# Patient Record
Sex: Female | Born: 1945
Health system: Southern US, Community
[De-identification: ages and names within clinical notes are randomized; demographics above are authoritative.]

## PROBLEM LIST (undated history)

## (undated) DIAGNOSIS — K219 Gastro-esophageal reflux disease without esophagitis: Secondary | ICD-10-CM

## (undated) DIAGNOSIS — M1990 Unspecified osteoarthritis, unspecified site: Secondary | ICD-10-CM

## (undated) HISTORY — DX: Gastro-esophageal reflux disease without esophagitis: K21.9

## (undated) HISTORY — PX: TUBAL LIGATION: SHX77

## (undated) HISTORY — PX: COLONOSCOPY: SHX174

## (undated) HISTORY — DX: Unspecified osteoarthritis, unspecified site: M19.90

## (undated) HISTORY — PX: TOTAL ABDOMINAL HYSTERECTOMY: SHX209

## (undated) HISTORY — PX: CHOLECYSTECTOMY: SHX55

---

## 2007-10-10 ENCOUNTER — Ambulatory Visit: Payer: Self-pay | Admitting: Cardiology

## 2011-06-07 ENCOUNTER — Encounter (INDEPENDENT_AMBULATORY_CARE_PROVIDER_SITE_OTHER): Payer: Self-pay | Admitting: *Deleted

## 2011-06-22 ENCOUNTER — Ambulatory Visit (INDEPENDENT_AMBULATORY_CARE_PROVIDER_SITE_OTHER): Payer: Medicare Other | Admitting: Internal Medicine

## 2011-06-22 ENCOUNTER — Encounter (INDEPENDENT_AMBULATORY_CARE_PROVIDER_SITE_OTHER): Payer: Self-pay | Admitting: Internal Medicine

## 2011-06-22 DIAGNOSIS — K219 Gastro-esophageal reflux disease without esophagitis: Secondary | ICD-10-CM

## 2011-06-22 DIAGNOSIS — K5732 Diverticulitis of large intestine without perforation or abscess without bleeding: Secondary | ICD-10-CM

## 2011-06-22 DIAGNOSIS — G8929 Other chronic pain: Secondary | ICD-10-CM

## 2011-06-22 DIAGNOSIS — N200 Calculus of kidney: Secondary | ICD-10-CM

## 2011-06-22 DIAGNOSIS — K5792 Diverticulitis of intestine, part unspecified, without perforation or abscess without bleeding: Secondary | ICD-10-CM

## 2011-06-22 NOTE — Progress Notes (Signed)
Subjective:     Patient ID: Brenda Sullivan, female   DOB: April 07, 1946, 66 y.o.   MRN: [id number]  HPI Brenda Sullivan is a 66 yr old female referred to our office by [facility] for GERD with treatment failure. She has tried Prilosec, Nexium, Aciphex,  and Protonix without any relief. She was recently started on Dexilant a few weeks ago. She says the Dexilant has helped.  She tells me she is 50% better since starting the Dexilant. She tells me she has had 2 episodes of acid reflux since starting the Dexilant.  She takes Tums or Maalox if she needs it. She tells me she is having acid reflux a couple of times a week. Appetite is good. No weight loss. She avoids spicy foods. She usually has a BM about every day or every other day.  She tells me she has had [employer] wrap x 2 in the past. No dysphagia.  Her acid reflux x 3 week. Burping frequently.   Review of Systems see hpi     Current Outpatient Prescriptions  Medication Sig Dispense Refill  . aspirin 81 MG tablet Take 81 mg by mouth daily.      . calcium carbonate 200 MG capsule Take 250 mg by mouth 2 (two) times daily with a meal.      . dexlansoprazole (DEXILANT) 60 MG capsule Take 60 mg by mouth daily.      Marland Kitchen estrogens, conjugated, (PREMARIN) 0.45 MG tablet Take 0.45 mg by mouth daily. Take daily for 21 days then do not take for 7 days.      Marland Kitchen FLUoxetine (PROZAC) 20 MG capsule Take 20 mg by mouth daily.      Marland Kitchen gabapentin (NEURONTIN) 100 MG capsule Take 300 mg by mouth at bedtime as needed and may repeat dose one time if needed.      Marland Kitchen oxyCODONE (OXYCONTIN) 20 MG 12 hr tablet Take 20 mg by mouth every 12 (twelve) hours.      . pyridoxine (B-6) 200 MG tablet Take 200 mg by mouth daily.      . ranitidine (ZANTAC) 150 MG capsule Take 150 mg by mouth as needed.      Marland Kitchen tiZANidine (ZANAFLEX) 4 MG capsule Take 4 mg by mouth 3 (three) times daily.       Past Medical History  Diagnosis Date  . GERD (gastroesophageal reflux disease)   . Arthritis      No Known  Allergies    History reviewed. No pertinent family history. History   Social History  . Marital Status: Married    Spouse Name: N/A    Number of Children: N/A  . Years of Education: N/A   Occupational History  . Not on file.   Social History Main Topics  . Smoking status: Never Smoker   . Smokeless tobacco: Not on file  . Alcohol Use: No  . Drug Use: No  . Sexually Active: Not on file   Other Topics Concern  . Not on file   Social History Narrative  . No narrative on file   Family Status  Relation Status Death Age  . Mother Deceased     Stomach and intestional  . Father Deceased     CVA, CAD  . Sister Deceased     DM2, Needed a liver transplant    Objective:   Physical Exam  Filed Vitals:   06/22/11 1013  Height: 5\' 3"  (1.6 m)  Weight: 169 lb 14.4 oz (77.066 kg)   Alert  and oriented. Skin warm and dry. Oral mucosa is moist.   . Sclera anicteric, conjunctivae is pink. Thyroid not enlarged. No cervical lymphadenopathy. Lungs clear. Heart regular rate and rhythm.  Abdomen is soft. Bowel sounds are positive. No hepatomegaly. No abdominal masses felt. No tenderness.  No edema to lower extremities. Patient is alert and oriented.      Assessment:    GERD which is better now after starting the Dexilant.     Plan:   PR in 2 weeks. If no significant improvement, will consider an EGD.  Instructions on GERD given to patient with a GERD diet.

## 2011-06-22 NOTE — Patient Instructions (Addendum)
HOB up. Conitinue the Dexilant. PR in 2 weeks.

## 2011-10-13 ENCOUNTER — Encounter (INDEPENDENT_AMBULATORY_CARE_PROVIDER_SITE_OTHER): Payer: Self-pay

## 2013-05-08 HISTORY — PX: BREAST BIOPSY: SHX20

## 2014-03-04 ENCOUNTER — Other Ambulatory Visit: Payer: Self-pay | Admitting: Internal Medicine

## 2014-03-04 DIAGNOSIS — R921 Mammographic calcification found on diagnostic imaging of breast: Secondary | ICD-10-CM

## 2014-03-16 ENCOUNTER — Ambulatory Visit
Admission: RE | Admit: 2014-03-16 | Discharge: 2014-03-16 | Disposition: A | Discharge status: Home / Self Care | Payer: Medicare Other | Source: Ambulatory Visit | Attending: Internal Medicine | Admitting: Internal Medicine

## 2014-03-16 ENCOUNTER — Encounter (INDEPENDENT_AMBULATORY_CARE_PROVIDER_SITE_OTHER): Payer: Self-pay

## 2014-03-16 DIAGNOSIS — R921 Mammographic calcification found on diagnostic imaging of breast: Secondary | ICD-10-CM

## 2014-08-05 ENCOUNTER — Encounter (INDEPENDENT_AMBULATORY_CARE_PROVIDER_SITE_OTHER): Payer: Self-pay | Admitting: *Deleted

## 2014-08-05 ENCOUNTER — Encounter (INDEPENDENT_AMBULATORY_CARE_PROVIDER_SITE_OTHER): Payer: Self-pay

## 2016-05-18 DIAGNOSIS — R300 Dysuria: Secondary | ICD-10-CM | POA: Diagnosis not present

## 2016-05-18 DIAGNOSIS — N3281 Overactive bladder: Secondary | ICD-10-CM | POA: Diagnosis not present

## 2016-06-14 DIAGNOSIS — B356 Tinea cruris: Secondary | ICD-10-CM | POA: Diagnosis not present

## 2016-06-14 DIAGNOSIS — J069 Acute upper respiratory infection, unspecified: Secondary | ICD-10-CM | POA: Diagnosis not present

## 2016-06-27 DIAGNOSIS — M47812 Spondylosis without myelopathy or radiculopathy, cervical region: Secondary | ICD-10-CM | POA: Diagnosis not present

## 2016-06-27 DIAGNOSIS — Z79891 Long term (current) use of opiate analgesic: Secondary | ICD-10-CM | POA: Diagnosis not present

## 2016-06-27 DIAGNOSIS — M25571 Pain in right ankle and joints of right foot: Secondary | ICD-10-CM | POA: Diagnosis not present

## 2016-06-27 DIAGNOSIS — G894 Chronic pain syndrome: Secondary | ICD-10-CM | POA: Diagnosis not present

## 2016-08-01 DIAGNOSIS — Z79899 Other long term (current) drug therapy: Secondary | ICD-10-CM | POA: Diagnosis not present

## 2016-08-01 DIAGNOSIS — Z1211 Encounter for screening for malignant neoplasm of colon: Secondary | ICD-10-CM | POA: Diagnosis not present

## 2016-08-01 DIAGNOSIS — E2839 Other primary ovarian failure: Secondary | ICD-10-CM | POA: Diagnosis not present

## 2016-08-01 DIAGNOSIS — Z1389 Encounter for screening for other disorder: Secondary | ICD-10-CM | POA: Diagnosis not present

## 2016-08-01 DIAGNOSIS — Z299 Encounter for prophylactic measures, unspecified: Secondary | ICD-10-CM | POA: Diagnosis not present

## 2016-08-01 DIAGNOSIS — Z7189 Other specified counseling: Secondary | ICD-10-CM | POA: Diagnosis not present

## 2016-08-01 DIAGNOSIS — R69 Illness, unspecified: Secondary | ICD-10-CM | POA: Diagnosis not present

## 2016-08-01 DIAGNOSIS — Z0000 Encounter for general adult medical examination without abnormal findings: Secondary | ICD-10-CM | POA: Diagnosis not present

## 2016-08-01 DIAGNOSIS — Z1231 Encounter for screening mammogram for malignant neoplasm of breast: Secondary | ICD-10-CM | POA: Diagnosis not present

## 2016-08-01 DIAGNOSIS — Z713 Dietary counseling and surveillance: Secondary | ICD-10-CM | POA: Diagnosis not present

## 2016-08-02 DIAGNOSIS — Z79899 Other long term (current) drug therapy: Secondary | ICD-10-CM | POA: Diagnosis not present

## 2016-08-02 DIAGNOSIS — Z0000 Encounter for general adult medical examination without abnormal findings: Secondary | ICD-10-CM | POA: Diagnosis not present

## 2016-08-02 DIAGNOSIS — R69 Illness, unspecified: Secondary | ICD-10-CM | POA: Diagnosis not present

## 2016-08-24 DIAGNOSIS — Z79891 Long term (current) use of opiate analgesic: Secondary | ICD-10-CM | POA: Diagnosis not present

## 2016-08-24 DIAGNOSIS — G894 Chronic pain syndrome: Secondary | ICD-10-CM | POA: Diagnosis not present

## 2016-08-24 DIAGNOSIS — M47812 Spondylosis without myelopathy or radiculopathy, cervical region: Secondary | ICD-10-CM | POA: Diagnosis not present

## 2016-08-24 DIAGNOSIS — M25571 Pain in right ankle and joints of right foot: Secondary | ICD-10-CM | POA: Diagnosis not present

## 2016-10-19 DIAGNOSIS — M47812 Spondylosis without myelopathy or radiculopathy, cervical region: Secondary | ICD-10-CM | POA: Diagnosis not present

## 2016-10-19 DIAGNOSIS — Z79891 Long term (current) use of opiate analgesic: Secondary | ICD-10-CM | POA: Diagnosis not present

## 2016-10-19 DIAGNOSIS — M25571 Pain in right ankle and joints of right foot: Secondary | ICD-10-CM | POA: Diagnosis not present

## 2016-10-19 DIAGNOSIS — G894 Chronic pain syndrome: Secondary | ICD-10-CM | POA: Diagnosis not present

## 2016-12-20 ENCOUNTER — Ambulatory Visit: Payer: Self-pay | Admitting: Urology

## 2016-12-29 DIAGNOSIS — G894 Chronic pain syndrome: Secondary | ICD-10-CM | POA: Diagnosis not present

## 2016-12-29 DIAGNOSIS — M47812 Spondylosis without myelopathy or radiculopathy, cervical region: Secondary | ICD-10-CM | POA: Diagnosis not present

## 2016-12-29 DIAGNOSIS — Z79891 Long term (current) use of opiate analgesic: Secondary | ICD-10-CM | POA: Diagnosis not present

## 2016-12-29 DIAGNOSIS — M25571 Pain in right ankle and joints of right foot: Secondary | ICD-10-CM | POA: Diagnosis not present

## 2017-01-02 DIAGNOSIS — Z299 Encounter for prophylactic measures, unspecified: Secondary | ICD-10-CM | POA: Diagnosis not present

## 2017-01-02 DIAGNOSIS — R600 Localized edema: Secondary | ICD-10-CM | POA: Diagnosis not present

## 2017-01-02 DIAGNOSIS — R69 Illness, unspecified: Secondary | ICD-10-CM | POA: Diagnosis not present

## 2017-01-02 DIAGNOSIS — Z6835 Body mass index (BMI) 35.0-35.9, adult: Secondary | ICD-10-CM | POA: Diagnosis not present

## 2017-01-02 DIAGNOSIS — M19079 Primary osteoarthritis, unspecified ankle and foot: Secondary | ICD-10-CM | POA: Diagnosis not present

## 2017-01-02 DIAGNOSIS — N951 Menopausal and female climacteric states: Secondary | ICD-10-CM | POA: Diagnosis not present

## 2017-01-16 ENCOUNTER — Other Ambulatory Visit: Payer: Self-pay | Admitting: Urology

## 2017-01-16 DIAGNOSIS — N200 Calculus of kidney: Secondary | ICD-10-CM

## 2017-02-07 ENCOUNTER — Ambulatory Visit: Payer: Self-pay | Admitting: Urology

## 2017-02-13 ENCOUNTER — Ambulatory Visit (HOSPITAL_COMMUNITY)
Admission: RE | Admit: 2017-02-13 | Discharge: 2017-02-13 | Disposition: A | Discharge status: Home / Self Care | Payer: Medicare HMO | Source: Ambulatory Visit | Attending: Urology | Admitting: Urology

## 2017-02-13 DIAGNOSIS — N200 Calculus of kidney: Secondary | ICD-10-CM | POA: Insufficient documentation

## 2017-02-21 ENCOUNTER — Other Ambulatory Visit (HOSPITAL_COMMUNITY)
Admission: AD | Admit: 2017-02-21 | Discharge: 2017-02-21 | Disposition: A | Discharge status: Home / Self Care | Payer: Medicare HMO | Source: Skilled Nursing Facility | Attending: Urology | Admitting: Urology

## 2017-02-21 ENCOUNTER — Ambulatory Visit (INDEPENDENT_AMBULATORY_CARE_PROVIDER_SITE_OTHER): Payer: Medicare HMO | Admitting: Urology

## 2017-02-21 DIAGNOSIS — N3281 Overactive bladder: Secondary | ICD-10-CM | POA: Diagnosis not present

## 2017-02-21 DIAGNOSIS — R300 Dysuria: Secondary | ICD-10-CM | POA: Diagnosis not present

## 2017-02-21 DIAGNOSIS — N200 Calculus of kidney: Secondary | ICD-10-CM | POA: Diagnosis not present

## 2017-02-24 LAB — URINE CULTURE: Culture: 70000 — AB

## 2017-02-27 DIAGNOSIS — Z79891 Long term (current) use of opiate analgesic: Secondary | ICD-10-CM | POA: Diagnosis not present

## 2017-02-27 DIAGNOSIS — M47812 Spondylosis without myelopathy or radiculopathy, cervical region: Secondary | ICD-10-CM | POA: Diagnosis not present

## 2017-02-27 DIAGNOSIS — M25571 Pain in right ankle and joints of right foot: Secondary | ICD-10-CM | POA: Diagnosis not present

## 2017-02-27 DIAGNOSIS — G894 Chronic pain syndrome: Secondary | ICD-10-CM | POA: Diagnosis not present

## 2017-04-26 DIAGNOSIS — Z79891 Long term (current) use of opiate analgesic: Secondary | ICD-10-CM | POA: Diagnosis not present

## 2017-04-26 DIAGNOSIS — G894 Chronic pain syndrome: Secondary | ICD-10-CM | POA: Diagnosis not present

## 2017-04-26 DIAGNOSIS — M25571 Pain in right ankle and joints of right foot: Secondary | ICD-10-CM | POA: Diagnosis not present

## 2017-04-26 DIAGNOSIS — M47812 Spondylosis without myelopathy or radiculopathy, cervical region: Secondary | ICD-10-CM | POA: Diagnosis not present

## 2017-06-26 DIAGNOSIS — M25571 Pain in right ankle and joints of right foot: Secondary | ICD-10-CM | POA: Diagnosis not present

## 2017-06-26 DIAGNOSIS — M47812 Spondylosis without myelopathy or radiculopathy, cervical region: Secondary | ICD-10-CM | POA: Diagnosis not present

## 2017-06-26 DIAGNOSIS — Z79891 Long term (current) use of opiate analgesic: Secondary | ICD-10-CM | POA: Diagnosis not present

## 2017-06-26 DIAGNOSIS — G894 Chronic pain syndrome: Secondary | ICD-10-CM | POA: Diagnosis not present

## 2017-08-15 DIAGNOSIS — Z1211 Encounter for screening for malignant neoplasm of colon: Secondary | ICD-10-CM | POA: Diagnosis not present

## 2017-08-15 DIAGNOSIS — Z1331 Encounter for screening for depression: Secondary | ICD-10-CM | POA: Diagnosis not present

## 2017-08-15 DIAGNOSIS — E2839 Other primary ovarian failure: Secondary | ICD-10-CM | POA: Diagnosis not present

## 2017-08-15 DIAGNOSIS — R69 Illness, unspecified: Secondary | ICD-10-CM | POA: Diagnosis not present

## 2017-08-15 DIAGNOSIS — Z7189 Other specified counseling: Secondary | ICD-10-CM | POA: Diagnosis not present

## 2017-08-15 DIAGNOSIS — Z6835 Body mass index (BMI) 35.0-35.9, adult: Secondary | ICD-10-CM | POA: Diagnosis not present

## 2017-08-15 DIAGNOSIS — Z0000 Encounter for general adult medical examination without abnormal findings: Secondary | ICD-10-CM | POA: Diagnosis not present

## 2017-08-15 DIAGNOSIS — Z299 Encounter for prophylactic measures, unspecified: Secondary | ICD-10-CM | POA: Diagnosis not present

## 2017-08-15 DIAGNOSIS — R21 Rash and other nonspecific skin eruption: Secondary | ICD-10-CM | POA: Diagnosis not present

## 2017-08-15 DIAGNOSIS — Z1339 Encounter for screening examination for other mental health and behavioral disorders: Secondary | ICD-10-CM | POA: Diagnosis not present

## 2017-08-17 DIAGNOSIS — Z79899 Other long term (current) drug therapy: Secondary | ICD-10-CM | POA: Diagnosis not present

## 2017-08-17 DIAGNOSIS — E7800 Pure hypercholesterolemia, unspecified: Secondary | ICD-10-CM | POA: Diagnosis not present

## 2017-08-17 DIAGNOSIS — R5383 Other fatigue: Secondary | ICD-10-CM | POA: Diagnosis not present

## 2017-08-23 DIAGNOSIS — M47812 Spondylosis without myelopathy or radiculopathy, cervical region: Secondary | ICD-10-CM | POA: Diagnosis not present

## 2017-08-23 DIAGNOSIS — M25571 Pain in right ankle and joints of right foot: Secondary | ICD-10-CM | POA: Diagnosis not present

## 2017-08-23 DIAGNOSIS — G894 Chronic pain syndrome: Secondary | ICD-10-CM | POA: Diagnosis not present

## 2017-08-23 DIAGNOSIS — Z79891 Long term (current) use of opiate analgesic: Secondary | ICD-10-CM | POA: Diagnosis not present

## 2017-08-31 ENCOUNTER — Other Ambulatory Visit: Payer: Self-pay | Admitting: Urology

## 2017-08-31 DIAGNOSIS — N200 Calculus of kidney: Secondary | ICD-10-CM

## 2017-09-06 DIAGNOSIS — E7800 Pure hypercholesterolemia, unspecified: Secondary | ICD-10-CM | POA: Diagnosis not present

## 2017-09-06 DIAGNOSIS — E2839 Other primary ovarian failure: Secondary | ICD-10-CM | POA: Diagnosis not present

## 2017-09-06 DIAGNOSIS — Z299 Encounter for prophylactic measures, unspecified: Secondary | ICD-10-CM | POA: Diagnosis not present

## 2017-09-06 DIAGNOSIS — L309 Dermatitis, unspecified: Secondary | ICD-10-CM | POA: Diagnosis not present

## 2017-09-06 DIAGNOSIS — Z6835 Body mass index (BMI) 35.0-35.9, adult: Secondary | ICD-10-CM | POA: Diagnosis not present

## 2017-09-06 DIAGNOSIS — Z713 Dietary counseling and surveillance: Secondary | ICD-10-CM | POA: Diagnosis not present

## 2017-10-18 ENCOUNTER — Ambulatory Visit (HOSPITAL_COMMUNITY)
Admission: RE | Admit: 2017-10-18 | Discharge: 2017-10-18 | Disposition: A | Discharge status: Home / Self Care | Payer: Medicare HMO | Source: Ambulatory Visit | Attending: Urology | Admitting: Urology

## 2017-10-18 DIAGNOSIS — N200 Calculus of kidney: Secondary | ICD-10-CM | POA: Diagnosis not present

## 2017-10-25 DIAGNOSIS — M25571 Pain in right ankle and joints of right foot: Secondary | ICD-10-CM | POA: Diagnosis not present

## 2017-10-25 DIAGNOSIS — M47812 Spondylosis without myelopathy or radiculopathy, cervical region: Secondary | ICD-10-CM | POA: Diagnosis not present

## 2017-10-25 DIAGNOSIS — Z79891 Long term (current) use of opiate analgesic: Secondary | ICD-10-CM | POA: Diagnosis not present

## 2017-10-25 DIAGNOSIS — G894 Chronic pain syndrome: Secondary | ICD-10-CM | POA: Diagnosis not present

## 2017-12-18 DIAGNOSIS — R350 Frequency of micturition: Secondary | ICD-10-CM | POA: Diagnosis not present

## 2017-12-18 DIAGNOSIS — N390 Urinary tract infection, site not specified: Secondary | ICD-10-CM | POA: Diagnosis not present

## 2017-12-18 DIAGNOSIS — Z6835 Body mass index (BMI) 35.0-35.9, adult: Secondary | ICD-10-CM | POA: Diagnosis not present

## 2017-12-18 DIAGNOSIS — Z299 Encounter for prophylactic measures, unspecified: Secondary | ICD-10-CM | POA: Diagnosis not present

## 2017-12-18 DIAGNOSIS — R69 Illness, unspecified: Secondary | ICD-10-CM | POA: Diagnosis not present

## 2017-12-18 DIAGNOSIS — E7800 Pure hypercholesterolemia, unspecified: Secondary | ICD-10-CM | POA: Diagnosis not present

## 2017-12-27 DIAGNOSIS — M47812 Spondylosis without myelopathy or radiculopathy, cervical region: Secondary | ICD-10-CM | POA: Diagnosis not present

## 2017-12-27 DIAGNOSIS — M25571 Pain in right ankle and joints of right foot: Secondary | ICD-10-CM | POA: Diagnosis not present

## 2017-12-27 DIAGNOSIS — Z79891 Long term (current) use of opiate analgesic: Secondary | ICD-10-CM | POA: Diagnosis not present

## 2017-12-27 DIAGNOSIS — G894 Chronic pain syndrome: Secondary | ICD-10-CM | POA: Diagnosis not present

## 2018-02-21 DIAGNOSIS — M47812 Spondylosis without myelopathy or radiculopathy, cervical region: Secondary | ICD-10-CM | POA: Diagnosis not present

## 2018-02-21 DIAGNOSIS — G894 Chronic pain syndrome: Secondary | ICD-10-CM | POA: Diagnosis not present

## 2018-02-21 DIAGNOSIS — Z79891 Long term (current) use of opiate analgesic: Secondary | ICD-10-CM | POA: Diagnosis not present

## 2018-02-21 DIAGNOSIS — M25571 Pain in right ankle and joints of right foot: Secondary | ICD-10-CM | POA: Diagnosis not present

## 2018-04-19 DIAGNOSIS — K210 Gastro-esophageal reflux disease with esophagitis: Secondary | ICD-10-CM | POA: Diagnosis not present

## 2018-04-19 DIAGNOSIS — N951 Menopausal and female climacteric states: Secondary | ICD-10-CM | POA: Diagnosis not present

## 2018-04-19 DIAGNOSIS — Z299 Encounter for prophylactic measures, unspecified: Secondary | ICD-10-CM | POA: Diagnosis not present

## 2018-04-19 DIAGNOSIS — Z6835 Body mass index (BMI) 35.0-35.9, adult: Secondary | ICD-10-CM | POA: Diagnosis not present

## 2018-04-19 DIAGNOSIS — I10 Essential (primary) hypertension: Secondary | ICD-10-CM | POA: Diagnosis not present

## 2018-04-19 DIAGNOSIS — R69 Illness, unspecified: Secondary | ICD-10-CM | POA: Diagnosis not present

## 2018-04-25 DIAGNOSIS — M47812 Spondylosis without myelopathy or radiculopathy, cervical region: Secondary | ICD-10-CM | POA: Diagnosis not present

## 2018-04-25 DIAGNOSIS — G894 Chronic pain syndrome: Secondary | ICD-10-CM | POA: Diagnosis not present

## 2018-04-25 DIAGNOSIS — M25571 Pain in right ankle and joints of right foot: Secondary | ICD-10-CM | POA: Diagnosis not present

## 2018-04-25 DIAGNOSIS — Z79891 Long term (current) use of opiate analgesic: Secondary | ICD-10-CM | POA: Diagnosis not present

## 2018-05-21 DIAGNOSIS — R69 Illness, unspecified: Secondary | ICD-10-CM | POA: Diagnosis not present

## 2018-05-21 DIAGNOSIS — Z6834 Body mass index (BMI) 34.0-34.9, adult: Secondary | ICD-10-CM | POA: Diagnosis not present

## 2018-05-21 DIAGNOSIS — I10 Essential (primary) hypertension: Secondary | ICD-10-CM | POA: Diagnosis not present

## 2018-05-21 DIAGNOSIS — K219 Gastro-esophageal reflux disease without esophagitis: Secondary | ICD-10-CM | POA: Diagnosis not present

## 2018-05-21 DIAGNOSIS — G4700 Insomnia, unspecified: Secondary | ICD-10-CM | POA: Diagnosis not present

## 2018-06-27 DIAGNOSIS — Z79891 Long term (current) use of opiate analgesic: Secondary | ICD-10-CM | POA: Diagnosis not present

## 2018-06-27 DIAGNOSIS — M47812 Spondylosis without myelopathy or radiculopathy, cervical region: Secondary | ICD-10-CM | POA: Diagnosis not present

## 2018-06-27 DIAGNOSIS — G894 Chronic pain syndrome: Secondary | ICD-10-CM | POA: Diagnosis not present

## 2018-06-27 DIAGNOSIS — M25571 Pain in right ankle and joints of right foot: Secondary | ICD-10-CM | POA: Diagnosis not present

## 2018-08-20 DIAGNOSIS — Z79899 Other long term (current) drug therapy: Secondary | ICD-10-CM | POA: Diagnosis not present

## 2018-08-20 DIAGNOSIS — N390 Urinary tract infection, site not specified: Secondary | ICD-10-CM | POA: Diagnosis not present

## 2018-08-20 DIAGNOSIS — Z299 Encounter for prophylactic measures, unspecified: Secondary | ICD-10-CM | POA: Diagnosis not present

## 2018-08-20 DIAGNOSIS — Z1211 Encounter for screening for malignant neoplasm of colon: Secondary | ICD-10-CM | POA: Diagnosis not present

## 2018-08-20 DIAGNOSIS — Z0000 Encounter for general adult medical examination without abnormal findings: Secondary | ICD-10-CM | POA: Diagnosis not present

## 2018-08-20 DIAGNOSIS — Z1331 Encounter for screening for depression: Secondary | ICD-10-CM | POA: Diagnosis not present

## 2018-08-20 DIAGNOSIS — I10 Essential (primary) hypertension: Secondary | ICD-10-CM | POA: Diagnosis not present

## 2018-08-20 DIAGNOSIS — Z7189 Other specified counseling: Secondary | ICD-10-CM | POA: Diagnosis not present

## 2018-08-20 DIAGNOSIS — R5383 Other fatigue: Secondary | ICD-10-CM | POA: Diagnosis not present

## 2018-08-20 DIAGNOSIS — Z6835 Body mass index (BMI) 35.0-35.9, adult: Secondary | ICD-10-CM | POA: Diagnosis not present

## 2018-08-20 DIAGNOSIS — Z1339 Encounter for screening examination for other mental health and behavioral disorders: Secondary | ICD-10-CM | POA: Diagnosis not present

## 2018-08-20 DIAGNOSIS — E7800 Pure hypercholesterolemia, unspecified: Secondary | ICD-10-CM | POA: Diagnosis not present

## 2018-08-22 DIAGNOSIS — Z79891 Long term (current) use of opiate analgesic: Secondary | ICD-10-CM | POA: Diagnosis not present

## 2018-08-22 DIAGNOSIS — G894 Chronic pain syndrome: Secondary | ICD-10-CM | POA: Diagnosis not present

## 2018-08-22 DIAGNOSIS — M47812 Spondylosis without myelopathy or radiculopathy, cervical region: Secondary | ICD-10-CM | POA: Diagnosis not present

## 2018-08-22 DIAGNOSIS — M25571 Pain in right ankle and joints of right foot: Secondary | ICD-10-CM | POA: Diagnosis not present

## 2018-10-17 DIAGNOSIS — M47812 Spondylosis without myelopathy or radiculopathy, cervical region: Secondary | ICD-10-CM | POA: Diagnosis not present

## 2018-10-17 DIAGNOSIS — Z79891 Long term (current) use of opiate analgesic: Secondary | ICD-10-CM | POA: Diagnosis not present

## 2018-10-17 DIAGNOSIS — M25571 Pain in right ankle and joints of right foot: Secondary | ICD-10-CM | POA: Diagnosis not present

## 2018-10-17 DIAGNOSIS — G894 Chronic pain syndrome: Secondary | ICD-10-CM | POA: Diagnosis not present

## 2018-11-19 DIAGNOSIS — Z299 Encounter for prophylactic measures, unspecified: Secondary | ICD-10-CM | POA: Diagnosis not present

## 2018-11-19 DIAGNOSIS — Z6834 Body mass index (BMI) 34.0-34.9, adult: Secondary | ICD-10-CM | POA: Diagnosis not present

## 2018-11-19 DIAGNOSIS — E7800 Pure hypercholesterolemia, unspecified: Secondary | ICD-10-CM | POA: Diagnosis not present

## 2018-11-19 DIAGNOSIS — R69 Illness, unspecified: Secondary | ICD-10-CM | POA: Diagnosis not present

## 2018-11-19 DIAGNOSIS — I10 Essential (primary) hypertension: Secondary | ICD-10-CM | POA: Diagnosis not present

## 2018-12-12 DIAGNOSIS — M47812 Spondylosis without myelopathy or radiculopathy, cervical region: Secondary | ICD-10-CM | POA: Diagnosis not present

## 2018-12-12 DIAGNOSIS — M25571 Pain in right ankle and joints of right foot: Secondary | ICD-10-CM | POA: Diagnosis not present

## 2018-12-12 DIAGNOSIS — Z79891 Long term (current) use of opiate analgesic: Secondary | ICD-10-CM | POA: Diagnosis not present

## 2018-12-12 DIAGNOSIS — G894 Chronic pain syndrome: Secondary | ICD-10-CM | POA: Diagnosis not present

## 2018-12-18 DIAGNOSIS — R69 Illness, unspecified: Secondary | ICD-10-CM | POA: Diagnosis not present

## 2019-02-13 DIAGNOSIS — G894 Chronic pain syndrome: Secondary | ICD-10-CM | POA: Diagnosis not present

## 2019-02-13 DIAGNOSIS — M25571 Pain in right ankle and joints of right foot: Secondary | ICD-10-CM | POA: Diagnosis not present

## 2019-02-13 DIAGNOSIS — M47812 Spondylosis without myelopathy or radiculopathy, cervical region: Secondary | ICD-10-CM | POA: Diagnosis not present

## 2019-02-13 DIAGNOSIS — Z79891 Long term (current) use of opiate analgesic: Secondary | ICD-10-CM | POA: Diagnosis not present

## 2019-03-06 DIAGNOSIS — Z299 Encounter for prophylactic measures, unspecified: Secondary | ICD-10-CM | POA: Diagnosis not present

## 2019-03-06 DIAGNOSIS — J209 Acute bronchitis, unspecified: Secondary | ICD-10-CM | POA: Diagnosis not present

## 2019-03-06 DIAGNOSIS — Z6834 Body mass index (BMI) 34.0-34.9, adult: Secondary | ICD-10-CM | POA: Diagnosis not present

## 2019-03-06 DIAGNOSIS — R69 Illness, unspecified: Secondary | ICD-10-CM | POA: Diagnosis not present

## 2019-03-06 DIAGNOSIS — I10 Essential (primary) hypertension: Secondary | ICD-10-CM | POA: Diagnosis not present

## 2019-03-11 DIAGNOSIS — I10 Essential (primary) hypertension: Secondary | ICD-10-CM | POA: Diagnosis not present

## 2019-03-11 DIAGNOSIS — R32 Unspecified urinary incontinence: Secondary | ICD-10-CM | POA: Diagnosis not present

## 2019-03-11 DIAGNOSIS — Z6831 Body mass index (BMI) 31.0-31.9, adult: Secondary | ICD-10-CM | POA: Diagnosis not present

## 2019-03-11 DIAGNOSIS — R69 Illness, unspecified: Secondary | ICD-10-CM | POA: Diagnosis not present

## 2019-03-11 DIAGNOSIS — J209 Acute bronchitis, unspecified: Secondary | ICD-10-CM | POA: Diagnosis not present

## 2019-03-11 DIAGNOSIS — Z299 Encounter for prophylactic measures, unspecified: Secondary | ICD-10-CM | POA: Diagnosis not present

## 2019-03-12 DIAGNOSIS — E7800 Pure hypercholesterolemia, unspecified: Secondary | ICD-10-CM | POA: Diagnosis not present

## 2019-03-12 DIAGNOSIS — I10 Essential (primary) hypertension: Secondary | ICD-10-CM | POA: Diagnosis not present

## 2019-04-10 DIAGNOSIS — Z79891 Long term (current) use of opiate analgesic: Secondary | ICD-10-CM | POA: Diagnosis not present

## 2019-04-10 DIAGNOSIS — M25571 Pain in right ankle and joints of right foot: Secondary | ICD-10-CM | POA: Diagnosis not present

## 2019-04-10 DIAGNOSIS — G894 Chronic pain syndrome: Secondary | ICD-10-CM | POA: Diagnosis not present

## 2019-04-10 DIAGNOSIS — M47812 Spondylosis without myelopathy or radiculopathy, cervical region: Secondary | ICD-10-CM | POA: Diagnosis not present

## 2021-01-17 ENCOUNTER — Other Ambulatory Visit: Payer: Self-pay | Admitting: Internal Medicine

## 2021-01-17 DIAGNOSIS — Z139 Encounter for screening, unspecified: Secondary | ICD-10-CM

## 2021-01-18 ENCOUNTER — Other Ambulatory Visit: Payer: Self-pay

## 2021-01-18 ENCOUNTER — Ambulatory Visit
Admission: RE | Admit: 2021-01-18 | Discharge: 2021-01-18 | Disposition: A | Discharge status: Home / Self Care | Payer: Medicare HMO | Source: Ambulatory Visit | Attending: Internal Medicine | Admitting: Internal Medicine

## 2021-01-18 DIAGNOSIS — Z139 Encounter for screening, unspecified: Secondary | ICD-10-CM

## 2022-01-17 ENCOUNTER — Other Ambulatory Visit: Payer: Self-pay | Admitting: Internal Medicine

## 2022-01-17 DIAGNOSIS — Z1231 Encounter for screening mammogram for malignant neoplasm of breast: Secondary | ICD-10-CM

## 2022-01-18 ENCOUNTER — Inpatient Hospital Stay: Admission: RE | Admit: 2022-01-18 | Payer: Medicare HMO | Source: Ambulatory Visit

## 2022-03-02 ENCOUNTER — Ambulatory Visit
Admission: RE | Admit: 2022-03-02 | Discharge: 2022-03-02 | Disposition: A | Discharge status: Home / Self Care | Payer: Medicare HMO | Source: Ambulatory Visit | Attending: Internal Medicine | Admitting: Internal Medicine

## 2022-03-02 DIAGNOSIS — Z1231 Encounter for screening mammogram for malignant neoplasm of breast: Secondary | ICD-10-CM

## 2022-06-06 ENCOUNTER — Ambulatory Visit: Payer: Medicare HMO | Attending: Cardiology | Admitting: Cardiology

## 2022-06-06 ENCOUNTER — Encounter: Payer: Self-pay | Admitting: Cardiology

## 2022-06-06 ENCOUNTER — Encounter: Payer: Self-pay | Admitting: *Deleted

## 2022-06-06 VITALS — BP 120/76 | HR 64 | Ht 62.5 in | Wt 182.4 lb

## 2022-06-06 DIAGNOSIS — Z01812 Encounter for preprocedural laboratory examination: Secondary | ICD-10-CM | POA: Diagnosis not present

## 2022-06-06 DIAGNOSIS — R079 Chest pain, unspecified: Secondary | ICD-10-CM | POA: Diagnosis not present

## 2022-06-06 MED ORDER — METOPROLOL TARTRATE 50 MG PO TABS: 50.0000 mg | ORAL_TABLET | Freq: Once | ORAL | 0 refills | Status: AC

## 2022-06-06 NOTE — Progress Notes (Signed)
Clinical Summary Brenda Sullivan is a 77 y.o.female seen as a new consult for the following medical problems.  1.Chest pain - symptoms started about 6 months ago - can start udern right breast into midchest and epigastric area. Can last 15-20 minutes. Sharp pain, can be 10/10 but usually 5/10. Can have some nausea, weakness, some SOB. Can be worst with deep breathing. Can occur at rest or with exertion. No related to meals.  - history of GERD, on dexilant -occurs 1-2 times per month, thought recently happening weekly  - DOE with walking up 1 flight of stairs, occasional chest pain  CAD risk factors: HL, age  66/2023: LVEF 55-60%, grade I, mild MR, mild to mod TR -cannot run on treadmill due to chornic back pain.      Past Medical History:  Diagnosis Date   Arthritis    GERD (gastroesophageal reflux disease)      No Known Allergies   Current Outpatient Medications  Medication Sig Dispense Refill   aspirin 81 MG tablet Take 81 mg by mouth daily.     calcium carbonate 200 MG capsule Take 250 mg by mouth 2 (two) times daily with a meal.     dexlansoprazole (DEXILANT) 60 MG capsule Take 60 mg by mouth daily.     estrogens, conjugated, (PREMARIN) 0.45 MG tablet Take 0.45 mg by mouth daily. Take daily for 21 days then do not take for 7 days.     FLUoxetine (PROZAC) 20 MG capsule Take 20 mg by mouth daily.     gabapentin (NEURONTIN) 100 MG capsule Take 300 mg by mouth at bedtime as needed and may repeat dose one time if needed.     oxyCODONE (OXYCONTIN) 20 MG 12 hr tablet Take 20 mg by mouth every 12 (twelve) hours.     pyridoxine (B-6) 200 MG tablet Take 200 mg by mouth daily.     ranitidine (ZANTAC) 150 MG capsule Take 150 mg by mouth as needed.     tiZANidine (ZANAFLEX) 4 MG capsule Take 4 mg by mouth 3 (three) times daily.     No current facility-administered medications for this visit.     Past Surgical History:  Procedure Laterality Date   BREAST BIOPSY Left 2015    Benign   CHOLECYSTECTOMY     COLONOSCOPY     TOTAL ABDOMINAL HYSTERECTOMY     TUBAL LIGATION       No Known Allergies    Family History  Problem Relation Age of Onset   Breast cancer Neg Hx      Social History Ms. Macneill reports that she has never smoked. She does not have any smokeless tobacco history on file. Ms. Hedstrom reports no history of alcohol use.   Review of Systems CONSTITUTIONAL: No weight loss, fever, chills, weakness or fatigue.  HEENT: Eyes: No visual loss, blurred vision, double vision or yellow sclerae.No hearing loss, sneezing, congestion, runny nose or sore throat.  SKIN: No rash or itching.  CARDIOVASCULAR: per hpi RESPIRATORY: No shortness of breath, cough or sputum.  GASTROINTESTINAL: No anorexia, nausea, vomiting or diarrhea. No abdominal pain or blood.  GENITOURINARY: No burning on urination, no polyuria NEUROLOGICAL: No headache, dizziness, syncope, paralysis, ataxia, numbness or tingling in the extremities. No change in bowel or bladder control.  MUSCULOSKELETAL: No muscle, back pain, joint pain or stiffness.  LYMPHATICS: No enlarged nodes. No history of splenectomy.  PSYCHIATRIC: No history of depression or anxiety.  ENDOCRINOLOGIC: No reports of sweating,  cold or heat intolerance. No polyuria or polydipsia.  Marland Kitchen   Physical Examination Today's Vitals   06/06/22 1542  BP: 120/76  Pulse: 64  SpO2: 94%  Weight: 182 lb 6.4 oz (82.7 kg)  Height: 5' 2.5" (1.588 m)   Body mass index is 32.83 kg/m.  Gen: resting comfortably, no acute distress HEENT: no scleral icterus, pupils equal round and reactive, no palptable cervical adenopathy,  CV: RRR, no m/rg, no jvd Resp: Clear to auscultation bilaterally GI: abdomen is soft, non-tender, non-distended, normal bowel sounds, no hepatosplenomegaly MSK: extremities are warm, no edema.  Skin: warm, no rash Neuro:  no focal deficits Psych: appropriate affect     Assessment and Plan  1.Chest pain -  unclear etiology, some atypical features but has also had some DOE - echo overall benign - cannot run on treadmill due to back pains. Obese, heavy chested I think nuclear scan would be limited. I think best option would be coronary CTA - EKG shows SR, no ischemic changes.   F/u pending test results.       Arnoldo Lenis, M.D.

## 2022-06-06 NOTE — Patient Instructions (Signed)
Medication Instructions:  Continue all current medications.   Labwork: BMET - order given today Please do just prior to CT   Testing/Procedures: Coronary CTA Office will contact with results via phone, letter or mychart.     Follow-Up: Pending test results   Any Other Special Instructions Will Be Listed Below (If Applicable).   If you need a refill on your cardiac medications before your next appointment, please call your pharmacy.

## 2022-06-14 ENCOUNTER — Other Ambulatory Visit (HOSPITAL_COMMUNITY): Payer: Medicare HMO

## 2022-06-21 ENCOUNTER — Telehealth (HOSPITAL_COMMUNITY): Payer: Self-pay | Admitting: *Deleted

## 2022-06-21 ENCOUNTER — Other Ambulatory Visit (HOSPITAL_COMMUNITY)
Admission: RE | Admit: 2022-06-21 | Discharge: 2022-06-21 | Disposition: A | Discharge status: Home / Self Care | Payer: Medicare HMO | Source: Ambulatory Visit | Attending: Cardiology | Admitting: Cardiology

## 2022-06-21 DIAGNOSIS — R079 Chest pain, unspecified: Secondary | ICD-10-CM | POA: Diagnosis present

## 2022-06-21 DIAGNOSIS — Z01812 Encounter for preprocedural laboratory examination: Secondary | ICD-10-CM | POA: Insufficient documentation

## 2022-06-21 LAB — BASIC METABOLIC PANEL
Anion gap: 8 (ref 5–15)
BUN: 17 mg/dL (ref 8–23)
CO2: 27 mmol/L (ref 22–32)
Calcium: 8.9 mg/dL (ref 8.9–10.3)
Chloride: 104 mmol/L (ref 98–111)
Creatinine, Ser: 0.68 mg/dL (ref 0.44–1.00)
GFR, Estimated: >60 mL/min (ref >60)
Glucose, Bld: 74 mg/dL (ref 70–99)
Potassium: 3.7 mmol/L (ref 3.5–5.1)
Sodium: 139 mmol/L (ref 135–145)

## 2022-06-21 NOTE — Telephone Encounter (Signed)
Patient calling about her upcoming cardiac imaging study; pt verbalizes understanding of appt date/time, parking situation and where to check in, pre-test NPO status and medications ordered, and verified current allergies; name and call back number provided for further questions should they arise  Gordy Clement RN Navigator Cardiac Imaging [facility] [phone] office [phone] cell  Patient to hold her dose of metoprolol the night before and take 72m metoprolol tartrate two hours prior to her cardiac CT scan. She is aware to arrive at 3:30pm.

## 2022-06-23 ENCOUNTER — Ambulatory Visit (HOSPITAL_COMMUNITY)
Admission: RE | Admit: 2022-06-23 | Discharge: 2022-06-23 | Disposition: A | Discharge status: Home / Self Care | Payer: Medicare HMO | Source: Ambulatory Visit | Attending: Cardiology | Admitting: Cardiology

## 2022-06-23 ENCOUNTER — Other Ambulatory Visit: Payer: Self-pay | Admitting: Cardiovascular Disease

## 2022-06-23 DIAGNOSIS — R079 Chest pain, unspecified: Secondary | ICD-10-CM

## 2022-06-23 DIAGNOSIS — I2510 Atherosclerotic heart disease of native coronary artery without angina pectoris: Secondary | ICD-10-CM

## 2022-06-23 DIAGNOSIS — K449 Diaphragmatic hernia without obstruction or gangrene: Secondary | ICD-10-CM | POA: Diagnosis not present

## 2022-06-23 DIAGNOSIS — I700 Atherosclerosis of aorta: Secondary | ICD-10-CM | POA: Diagnosis not present

## 2022-06-23 MED ORDER — IOHEXOL 350 MG/ML SOLN
100.0000 mL | Freq: Once | INTRAVENOUS | Status: AC | PRN
  Administered 2022-06-23: 100 mL via INTRAVENOUS

## 2022-06-23 MED ORDER — NITROGLYCERIN 0.4 MG SL SUBL: 0.8000 mg | SUBLINGUAL_TABLET | Freq: Once | SUBLINGUAL | Status: AC

## 2022-06-23 MED ORDER — NITROGLYCERIN 0.4 MG SL SUBL
SUBLINGUAL_TABLET | SUBLINGUAL | Status: AC
  Administered 2022-06-23: 0.8 mg via SUBLINGUAL
  Filled 2022-06-23: qty 2

## 2022-06-24 ENCOUNTER — Ambulatory Visit (HOSPITAL_COMMUNITY)
Admission: RE | Admit: 2022-06-24 | Discharge: 2022-06-24 | Disposition: A | Discharge status: Home / Self Care | Payer: Medicare HMO | Source: Ambulatory Visit | Attending: Cardiovascular Disease | Admitting: Cardiovascular Disease

## 2022-06-24 DIAGNOSIS — I2510 Atherosclerotic heart disease of native coronary artery without angina pectoris: Secondary | ICD-10-CM | POA: Diagnosis present

## 2022-06-24 DIAGNOSIS — R931 Abnormal findings on diagnostic imaging of heart and coronary circulation: Secondary | ICD-10-CM

## 2022-06-30 ENCOUNTER — Telehealth: Payer: Self-pay | Admitting: Cardiology

## 2022-06-30 NOTE — Telephone Encounter (Signed)
Spoke with patient - informed her that test results are on Dr. Harl Bowie desk top awaiting his review.   As far at the rash goes, she can take Benadryl as she states she has been doing.  Encouraged her to increase fluid intake as well.  States rash is on her trunk area just below nipple line down to her abdomen.  Suggested she may want to see her pcp if not looking better by Monday as rashes have lots of causes.  Also, the fact that her test was done on 06/24/22 and has now been 5 days out.  She verbalized understanding.

## 2022-06-30 NOTE — Telephone Encounter (Signed)
Patient stated she is following-up on getting results from her test.  Patient stated since the test she has developed a really red, itchy rash and would like a prescription called in to [facility], [place].

## 2022-07-06 ENCOUNTER — Telehealth: Payer: Self-pay | Admitting: Cardiology

## 2022-07-06 NOTE — Telephone Encounter (Signed)
Normal coronary CT scan, no evidnece of any significant blockages. All her heart studies have been normal, symptoms are not cardiac related, can f/u with pcp to consider other causes. Can f/u with [place] as needed.     Zandra Abts MD

## 2022-07-06 NOTE — Telephone Encounter (Signed)
Pt called following up on her results. Pt also stated that her rash is starting to clear up but it is still there.

## 2022-07-07 NOTE — Telephone Encounter (Signed)
Normal results discussed with patient,she will follow up with Dr.Vyas, copied report to him

## 2023-09-04 IMAGING — MG MM DIGITAL SCREENING BILAT W/ TOMO AND CAD
8 series · 8 of 24 positions shown · non-contrast
Comparison: Previous exam(s).

CLINICAL DATA: Screening.

EXAM:
DIGITAL SCREENING BILATERAL MAMMOGRAM WITH TOMOSYNTHESIS AND CAD
TECHNIQUE: Bilateral screening digital craniocaudal and mediolateral oblique
mammograms were obtained. Bilateral screening digital breast
tomosynthesis was performed. The images were evaluated with
computer-aided detection.

[L CC synth-2D]
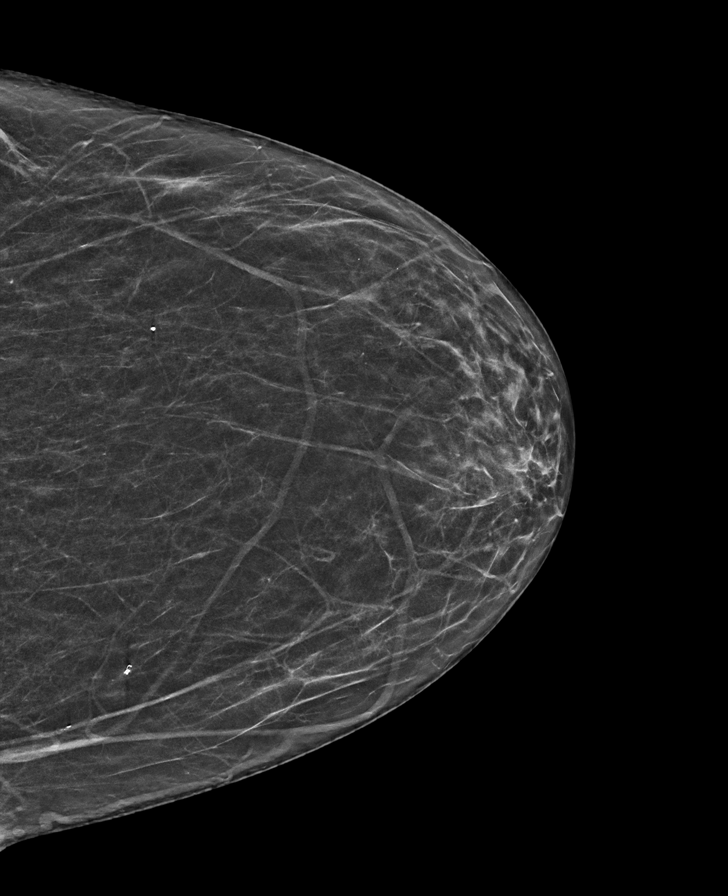

[L MLO synth-2D]
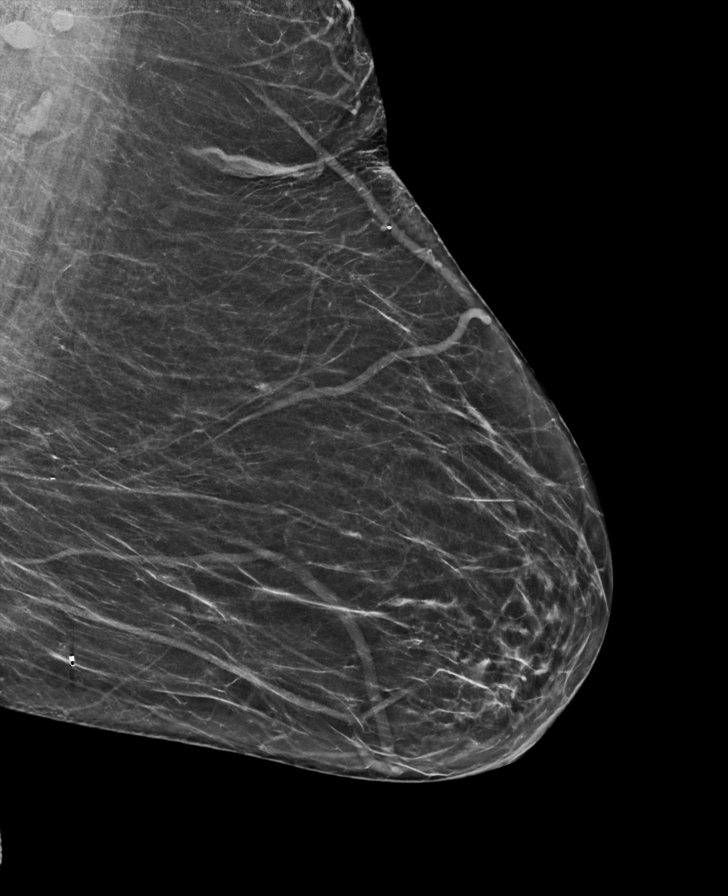

[R CC synth-2D]
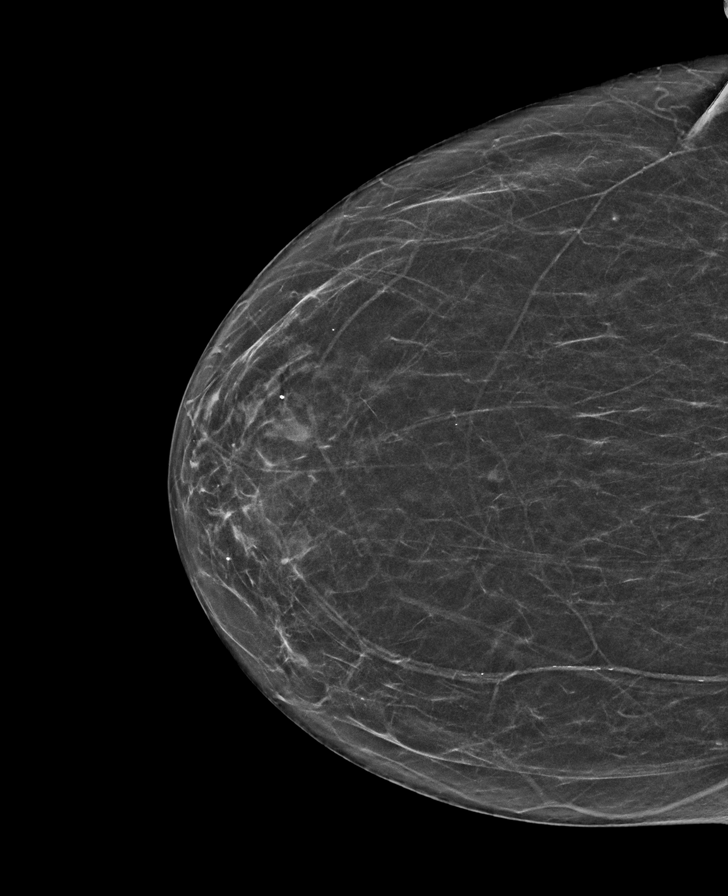

[R MLO synth-2D]
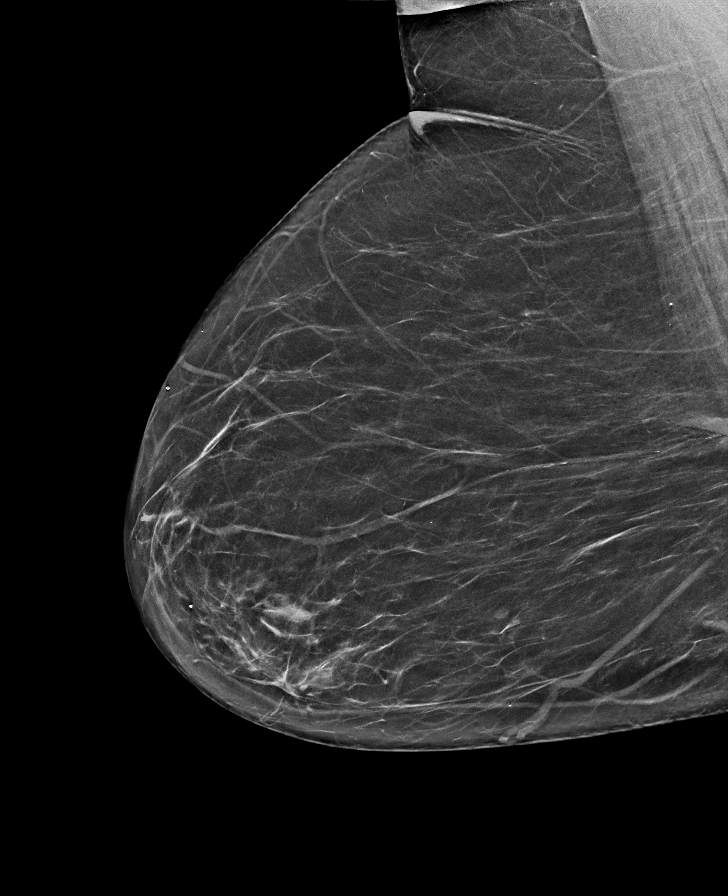

[R CC tomo · tomo slice 29/56.0]
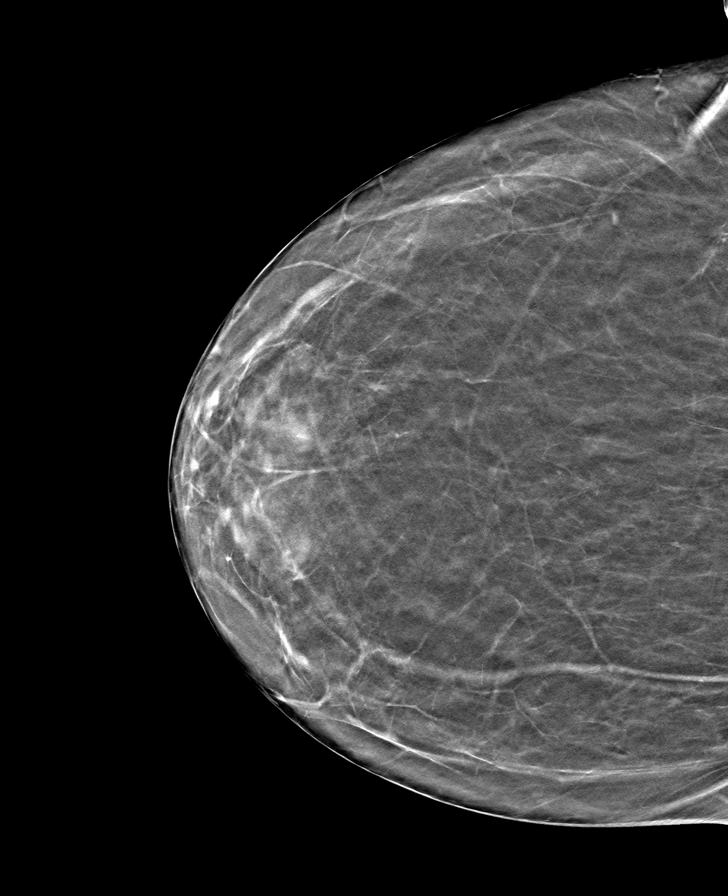

[L MLO tomo · tomo slice 35/69.0]
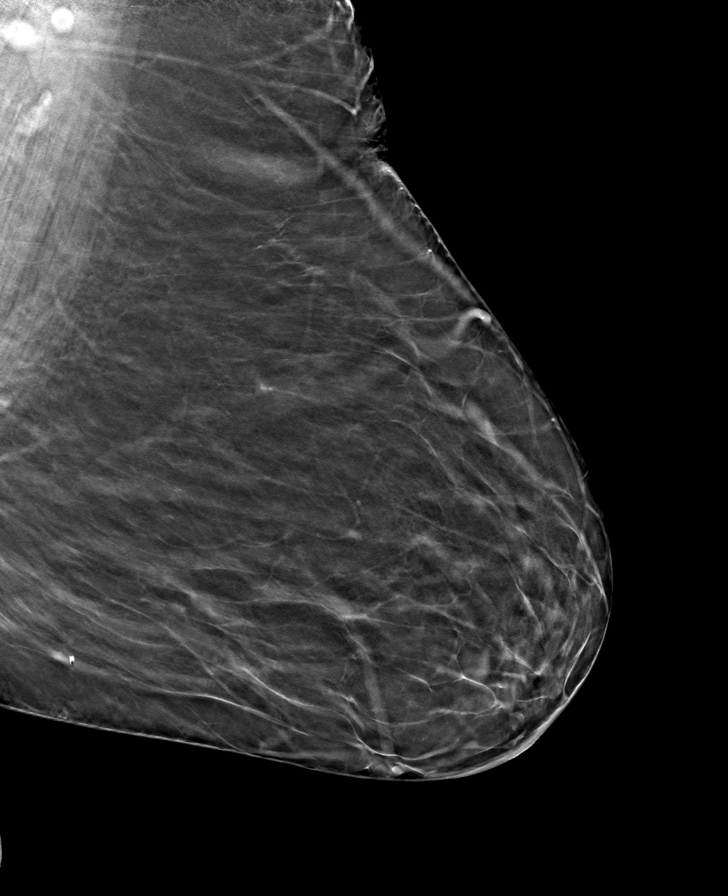

[R MLO tomo · tomo slice 34/67.0]
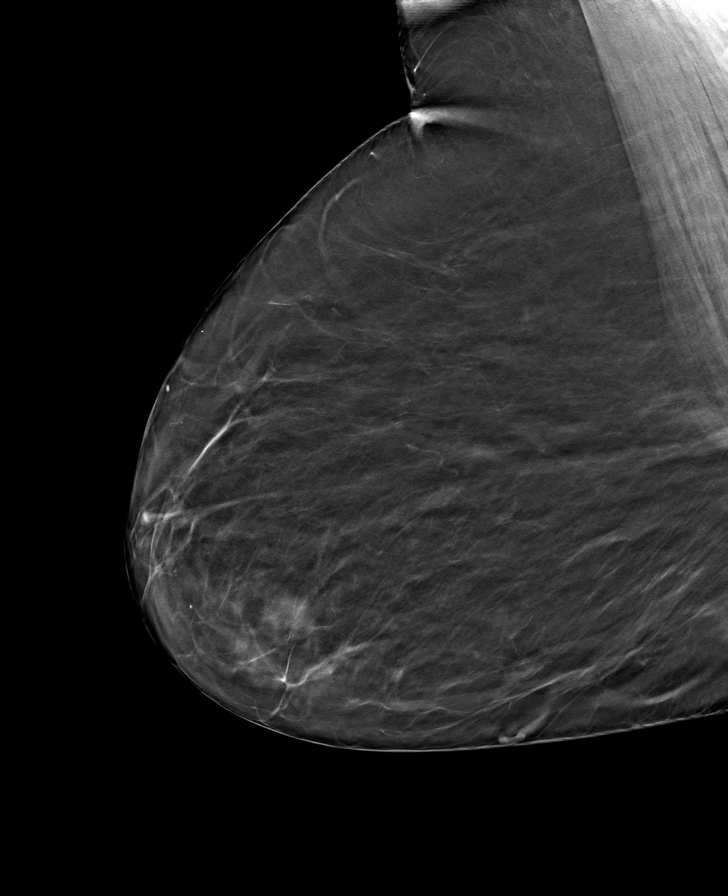

[L CC tomo · tomo slice 29/58.0]
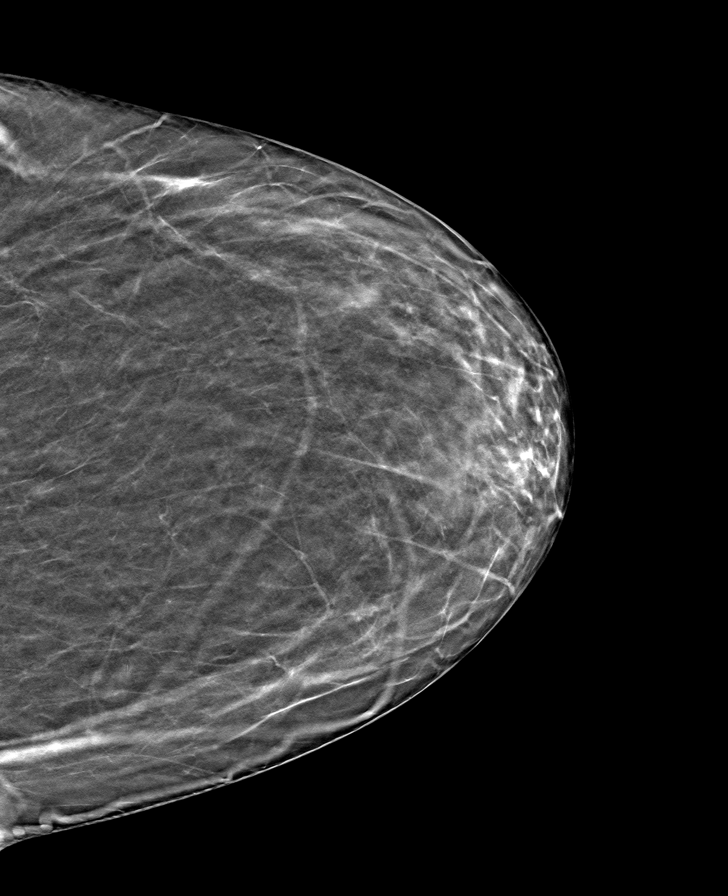

[8 of 24 positions shown; findings below may reference images not displayed]

ACR Breast Density Category b: There are scattered areas of
fibroglandular density.
FINDINGS: There are no findings suspicious for malignancy.
IMPRESSION: No mammographic evidence of malignancy. A result letter of this
screening mammogram will be mailed directly to the patient.

RECOMMENDATION:
Screening mammogram in one year. (Code:51-O-LD2)

BI-RADS CATEGORY  1: Negative.
